# Patient Record
Sex: Male | Born: 1955 | Race: White | Hispanic: No | Smoking: Former smoker
Health system: Southern US, Community
[De-identification: ages and names within clinical notes are randomized; demographics above are authoritative.]

## PROBLEM LIST (undated history)

## (undated) DIAGNOSIS — I82409 Acute embolism and thrombosis of unspecified deep veins of unspecified lower extremity: Secondary | ICD-10-CM

## (undated) DIAGNOSIS — I10 Essential (primary) hypertension: Secondary | ICD-10-CM

## (undated) DIAGNOSIS — M1990 Unspecified osteoarthritis, unspecified site: Secondary | ICD-10-CM

## (undated) DIAGNOSIS — I2699 Other pulmonary embolism without acute cor pulmonale: Secondary | ICD-10-CM

## (undated) HISTORY — DX: Unspecified osteoarthritis, unspecified site: M19.90

## (undated) HISTORY — PX: OTHER SURGICAL HISTORY: SHX169

## (undated) HISTORY — DX: Other pulmonary embolism without acute cor pulmonale: I26.99

## (undated) HISTORY — PX: WRIST GANGLION EXCISION: SHX840

## (undated) HISTORY — DX: Essential (primary) hypertension: I10

## (undated) HISTORY — DX: Acute embolism and thrombosis of unspecified deep veins of unspecified lower extremity: I82.409

---

## 1999-01-17 ENCOUNTER — Ambulatory Visit: Admission: RE | Admit: 1999-01-17 | Discharge: 1999-01-17 | Payer: Self-pay | Admitting: Otolaryngology

## 1999-03-18 ENCOUNTER — Ambulatory Visit: Admission: RE | Admit: 1999-03-18 | Discharge: 1999-03-18 | Payer: Self-pay | Admitting: Otolaryngology

## 2000-03-21 ENCOUNTER — Ambulatory Visit (HOSPITAL_BASED_OUTPATIENT_CLINIC_OR_DEPARTMENT_OTHER): Admission: RE | Admit: 2000-03-21 | Discharge: 2000-03-21 | Payer: Self-pay | Admitting: Orthopaedic Surgery

## 2002-11-12 ENCOUNTER — Encounter: Payer: Self-pay | Admitting: Family Medicine

## 2002-11-12 ENCOUNTER — Encounter: Admission: RE | Admit: 2002-11-12 | Discharge: 2002-11-12 | Payer: Self-pay | Admitting: Family Medicine

## 2003-08-17 ENCOUNTER — Encounter: Payer: Self-pay | Admitting: Emergency Medicine

## 2003-08-17 ENCOUNTER — Emergency Department (HOSPITAL_COMMUNITY): Admission: EM | Admit: 2003-08-17 | Discharge: 2003-08-18 | Payer: Self-pay | Admitting: Emergency Medicine

## 2017-10-05 DIAGNOSIS — R509 Fever, unspecified: Secondary | ICD-10-CM | POA: Diagnosis not present

## 2017-10-05 DIAGNOSIS — J90 Pleural effusion, not elsewhere classified: Secondary | ICD-10-CM | POA: Diagnosis not present

## 2017-10-05 DIAGNOSIS — I2699 Other pulmonary embolism without acute cor pulmonale: Secondary | ICD-10-CM | POA: Diagnosis not present

## 2017-10-05 DIAGNOSIS — R918 Other nonspecific abnormal finding of lung field: Secondary | ICD-10-CM | POA: Diagnosis not present

## 2017-10-05 DIAGNOSIS — J181 Lobar pneumonia, unspecified organism: Secondary | ICD-10-CM | POA: Diagnosis not present

## 2017-10-06 DIAGNOSIS — J181 Lobar pneumonia, unspecified organism: Secondary | ICD-10-CM | POA: Diagnosis not present

## 2017-10-06 DIAGNOSIS — J9601 Acute respiratory failure with hypoxia: Secondary | ICD-10-CM | POA: Diagnosis not present

## 2017-10-06 DIAGNOSIS — R509 Fever, unspecified: Secondary | ICD-10-CM | POA: Diagnosis not present

## 2017-10-06 DIAGNOSIS — R0602 Shortness of breath: Secondary | ICD-10-CM | POA: Diagnosis not present

## 2017-10-06 DIAGNOSIS — I82442 Acute embolism and thrombosis of left tibial vein: Secondary | ICD-10-CM | POA: Diagnosis not present

## 2017-10-06 DIAGNOSIS — J189 Pneumonia, unspecified organism: Secondary | ICD-10-CM | POA: Diagnosis not present

## 2017-10-06 DIAGNOSIS — J90 Pleural effusion, not elsewhere classified: Secondary | ICD-10-CM | POA: Diagnosis not present

## 2017-10-06 DIAGNOSIS — R918 Other nonspecific abnormal finding of lung field: Secondary | ICD-10-CM | POA: Diagnosis not present

## 2017-10-06 DIAGNOSIS — I517 Cardiomegaly: Secondary | ICD-10-CM | POA: Diagnosis not present

## 2017-10-06 DIAGNOSIS — I2699 Other pulmonary embolism without acute cor pulmonale: Secondary | ICD-10-CM | POA: Diagnosis not present

## 2017-10-22 DIAGNOSIS — I82442 Acute embolism and thrombosis of left tibial vein: Secondary | ICD-10-CM | POA: Diagnosis not present

## 2017-10-22 DIAGNOSIS — R221 Localized swelling, mass and lump, neck: Secondary | ICD-10-CM | POA: Diagnosis not present

## 2017-10-22 DIAGNOSIS — I2699 Other pulmonary embolism without acute cor pulmonale: Secondary | ICD-10-CM | POA: Diagnosis not present

## 2017-10-30 ENCOUNTER — Other Ambulatory Visit: Payer: Self-pay | Admitting: Gastroenterology

## 2017-10-30 ENCOUNTER — Other Ambulatory Visit: Payer: Self-pay | Admitting: Obstetrics and Gynecology

## 2017-10-30 DIAGNOSIS — I2699 Other pulmonary embolism without acute cor pulmonale: Secondary | ICD-10-CM | POA: Diagnosis not present

## 2017-10-30 DIAGNOSIS — Z86010 Personal history of colonic polyps: Secondary | ICD-10-CM | POA: Diagnosis not present

## 2017-10-30 DIAGNOSIS — K921 Melena: Secondary | ICD-10-CM | POA: Diagnosis not present

## 2017-10-30 DIAGNOSIS — R221 Localized swelling, mass and lump, neck: Secondary | ICD-10-CM

## 2017-10-31 ENCOUNTER — Other Ambulatory Visit: Payer: Self-pay | Admitting: Gastroenterology

## 2017-10-31 ENCOUNTER — Ambulatory Visit
Admission: RE | Admit: 2017-10-31 | Discharge: 2017-10-31 | Disposition: A | Discharge status: Home / Self Care | Payer: 59 | Source: Ambulatory Visit | Attending: Gastroenterology | Admitting: Gastroenterology

## 2017-10-31 DIAGNOSIS — R221 Localized swelling, mass and lump, neck: Secondary | ICD-10-CM | POA: Diagnosis not present

## 2017-11-07 ENCOUNTER — Other Ambulatory Visit: Payer: Self-pay | Admitting: Gastroenterology

## 2017-11-07 DIAGNOSIS — R221 Localized swelling, mass and lump, neck: Secondary | ICD-10-CM

## 2017-11-07 DIAGNOSIS — I2699 Other pulmonary embolism without acute cor pulmonale: Secondary | ICD-10-CM | POA: Diagnosis not present

## 2017-11-07 DIAGNOSIS — I82432 Acute embolism and thrombosis of left popliteal vein: Secondary | ICD-10-CM | POA: Diagnosis not present

## 2017-11-19 ENCOUNTER — Other Ambulatory Visit: Payer: 59

## 2017-11-22 ENCOUNTER — Ambulatory Visit
Admission: RE | Admit: 2017-11-22 | Discharge: 2017-11-22 | Disposition: A | Discharge status: Home / Self Care | Payer: 59 | Source: Ambulatory Visit | Attending: Gastroenterology | Admitting: Gastroenterology

## 2017-11-22 ENCOUNTER — Other Ambulatory Visit: Payer: 59

## 2017-11-22 DIAGNOSIS — R221 Localized swelling, mass and lump, neck: Secondary | ICD-10-CM | POA: Diagnosis not present

## 2017-11-22 MED ORDER — IOPAMIDOL (ISOVUE-300) INJECTION 61%
75.0000 mL | Freq: Once | INTRAVENOUS | Status: AC | PRN
  Administered 2017-11-22: 75 mL via INTRAVENOUS

## 2017-12-07 DIAGNOSIS — R682 Dry mouth, unspecified: Secondary | ICD-10-CM | POA: Diagnosis not present

## 2017-12-07 DIAGNOSIS — G4733 Obstructive sleep apnea (adult) (pediatric): Secondary | ICD-10-CM | POA: Diagnosis not present

## 2017-12-07 DIAGNOSIS — I2699 Other pulmonary embolism without acute cor pulmonale: Secondary | ICD-10-CM | POA: Diagnosis not present

## 2017-12-21 DIAGNOSIS — M3500 Sicca syndrome, unspecified: Secondary | ICD-10-CM | POA: Diagnosis not present

## 2017-12-21 DIAGNOSIS — G4733 Obstructive sleep apnea (adult) (pediatric): Secondary | ICD-10-CM | POA: Diagnosis not present

## 2017-12-21 DIAGNOSIS — R220 Localized swelling, mass and lump, head: Secondary | ICD-10-CM | POA: Diagnosis not present

## 2018-01-04 DIAGNOSIS — R221 Localized swelling, mass and lump, neck: Secondary | ICD-10-CM | POA: Diagnosis not present

## 2018-01-04 DIAGNOSIS — I2699 Other pulmonary embolism without acute cor pulmonale: Secondary | ICD-10-CM | POA: Diagnosis not present

## 2018-01-14 DIAGNOSIS — G4733 Obstructive sleep apnea (adult) (pediatric): Secondary | ICD-10-CM | POA: Diagnosis not present

## 2018-01-15 DIAGNOSIS — K921 Melena: Secondary | ICD-10-CM | POA: Diagnosis not present

## 2018-01-15 DIAGNOSIS — Z86010 Personal history of colonic polyps: Secondary | ICD-10-CM | POA: Diagnosis not present

## 2018-02-12 DIAGNOSIS — G4733 Obstructive sleep apnea (adult) (pediatric): Secondary | ICD-10-CM | POA: Diagnosis not present

## 2018-02-25 DIAGNOSIS — G4733 Obstructive sleep apnea (adult) (pediatric): Secondary | ICD-10-CM | POA: Diagnosis not present

## 2018-03-04 DIAGNOSIS — K5730 Diverticulosis of large intestine without perforation or abscess without bleeding: Secondary | ICD-10-CM | POA: Diagnosis not present

## 2018-03-04 DIAGNOSIS — D126 Benign neoplasm of colon, unspecified: Secondary | ICD-10-CM | POA: Diagnosis not present

## 2018-03-04 DIAGNOSIS — Z86010 Personal history of colonic polyps: Secondary | ICD-10-CM | POA: Diagnosis not present

## 2018-03-14 DIAGNOSIS — M79601 Pain in right arm: Secondary | ICD-10-CM | POA: Diagnosis not present

## 2018-03-15 ENCOUNTER — Ambulatory Visit (INDEPENDENT_AMBULATORY_CARE_PROVIDER_SITE_OTHER): Payer: 59 | Admitting: Orthopaedic Surgery

## 2018-03-15 ENCOUNTER — Ambulatory Visit (INDEPENDENT_AMBULATORY_CARE_PROVIDER_SITE_OTHER): Payer: 59

## 2018-03-15 ENCOUNTER — Encounter (INDEPENDENT_AMBULATORY_CARE_PROVIDER_SITE_OTHER): Payer: Self-pay | Admitting: Orthopaedic Surgery

## 2018-03-15 VITALS — BP 154/87 | HR 79 | Ht 69.0 in | Wt 225.0 lb

## 2018-03-15 DIAGNOSIS — M25521 Pain in right elbow: Secondary | ICD-10-CM | POA: Diagnosis not present

## 2018-03-15 NOTE — Progress Notes (Signed)
Office Visit Note/Orthopedic consult   Patient: Jeffery Nguyen           Date of Birth: 1956/08/09           MRN: [id number] Visit Date: 03/15/2018              Requested by: Orpah Melter, MD [address] [place],  [id number] PCP: Aura Dials, MD   Assessment & Plan: Visit Diagnoses:  1. Pain in right elbow           Probable partial tear right distal biceps tendon. Plan: Patient likely had a partial tear of the biceps tendon.  He is on Eliquis and does not have any ecchymosis which suggest it was not likely a significant tear.  He is able to demonstrate resistance against elbow flexion.  I plan to recheck him in a month to be careful avoid pulling.  We discussed to be as persistent problems MRI scan would need to be performed. Thank you for the opportunity to see him in consultation.   Follow-Up Instructions: Return in about 1 month (around 04/14/2018).   Orders:  Orders Placed This Encounter  Procedures  . XR Elbow 2 Views Right   No orders of the defined types were placed in this encounter.     Procedures: No procedures performed   Clinical Data: No additional findings.   Subjective: Chief Complaint  Patient presents with  . Right Arm - Pain    HPI 62 year old male referred by  Dr. Orpah Melter for injury when he  was lifting a lawnmower 2 days ago felt 2 pops with pain in the right  antecubital space.  He has had previous repair of his opposite left distal biceps done by me with repair in 1998.  Left arm is done well.  He had a remote injury over the dorsal proximal forearm that was skin grafted by Dr. Larose Kells prior to that.  Patient is not noticed any ecchymosis he said soreness.  He is able to get his elbow in full extension and can flex it but has pain over the distal biceps tendon.  Patient's had pain with resisted elbow flexion points directly over the distal biceps tendon.  Review of Systems negative for diabetes or heart trouble.  He is on  Eliquis for history of pulmonary embolism in October.   Objective: Vital Signs: BP (!) 154/87   Pulse 79   Ht 5\' 9"  (1.753 m)   Wt 225 lb (102.1 kg)   BMI 33.23 kg/m   Physical Exam  Constitutional: He is oriented to person, place, and time. He appears well-developed and well-nourished.  HENT:  Head: Normocephalic and atraumatic.  Eyes: Pupils are equal, round, and reactive to light. EOM are normal.  Neck: No tracheal deviation present. No thyromegaly present.  Cardiovascular: Normal rate.  Pulmonary/Chest: Effort normal. He has no wheezes.  Abdominal: Soft. Bowel sounds are normal.  Neurological: He is alert and oriented to person, place, and time.  Skin: Skin is warm and dry. Capillary refill takes less than 2 seconds.  Psychiatric: He has a normal mood and affect. His behavior is normal. Judgment and thought content normal.    Ortho Exam patient has tenderness of the distal right biceps tendon.  Mild subcutaneous swelling no ecchymosis.  Collateral ligaments are stable.  Sensation of the hand median radial is normal.  Opposite left arm shows healed scar from distal biceps reattachment.  Skin graft over the proximal lateral forearm from  old surgery.  Sensation of the fingers are intact.   Specialty Comments:  No specialty comments available.  Imaging: No results found.   PMFS History: There are no active problems to display for this patient.  Past Medical History:  Diagnosis Date  . Arthritis   . Deep vein thrombosis ([place])   . Hypertension   . Hypertension   . Pulmonary embolism ([place])     History reviewed. No pertinent family history.  Past Surgical History:  Procedure Laterality Date  . torn bicep Left   . WRIST GANGLION EXCISION Right    Social History   Occupational History  . Not on file  Tobacco Use  . Smoking status: Former Research scientist (life sciences)  . Smokeless tobacco: Never Used  Substance and Sexual Activity  . Alcohol use: Yes    Comment: occasional  . Drug  use: Never  . Sexual activity: Not on file

## 2018-03-17 ENCOUNTER — Encounter (INDEPENDENT_AMBULATORY_CARE_PROVIDER_SITE_OTHER): Payer: Self-pay | Admitting: Orthopaedic Surgery

## 2018-03-28 DIAGNOSIS — G4733 Obstructive sleep apnea (adult) (pediatric): Secondary | ICD-10-CM | POA: Diagnosis not present

## 2018-04-16 ENCOUNTER — Ambulatory Visit (INDEPENDENT_AMBULATORY_CARE_PROVIDER_SITE_OTHER): Payer: 59 | Admitting: Orthopaedic Surgery

## 2018-04-16 ENCOUNTER — Encounter (INDEPENDENT_AMBULATORY_CARE_PROVIDER_SITE_OTHER): Payer: Self-pay | Admitting: Orthopaedic Surgery

## 2018-04-16 VITALS — BP 144/85 | HR 53 | Ht 69.0 in | Wt 225.0 lb

## 2018-04-16 DIAGNOSIS — S46111A Strain of muscle, fascia and tendon of long head of biceps, right arm, initial encounter: Secondary | ICD-10-CM | POA: Insufficient documentation

## 2018-04-16 DIAGNOSIS — M66821 Spontaneous rupture of other tendons, right upper arm: Secondary | ICD-10-CM

## 2018-04-16 NOTE — Progress Notes (Signed)
Office Visit Note   Patient: Jeffery Nguyen           Date of Birth: 1955/12/16           MRN: [id number] Visit Date: 04/16/2018              Requested by: Aura Dials, [place], [place] [address] PCP: Aura Dials, MD   Assessment & Plan: Visit Diagnoses:  1. Partial degenerative rupture of biceps tendon, right     Plan: Patient will avoid any hard pulling with his right arm.  We discussed that if he continues to have symptoms over several months then repair would be reconsidered.  He is on Eliquis and might have to be bridged.  If he has increased symptoms he will call.  Follow-Up Instructions: No follow-ups on file.   Orders:  No orders of the defined types were placed in this encounter.  No orders of the defined types were placed in this encounter.     Procedures: No procedures performed   Clinical Data: No additional findings.   Subjective: Chief Complaint  Patient presents with  . Right Elbow - Follow-up    HPI 62 year old male returns approximately 1 month post partial rupture distal biceps tendon tear when he was lifting a lawnmower.  He states his symptoms have improved he is not taking pain medication.  Date of injury was 03/13/2018.  Of note his past history of opposite side left biceps tendon rupture and repair done in 1998 by me.  Left arm is been doing well.  Review of Systems 10 point review of systems updated unchanged.  He is on Eliquis for history of pulmonary embolism in October.   Objective: Vital Signs: BP (!) 144/85 (BP Location: Left Arm, Patient Position: Sitting, Cuff Size: Normal)   Pulse (!) 53   Ht 5\' 9"  (1.753 m)   Wt 225 lb (102.1 kg)   BMI 33.23 kg/m   Physical Exam  Constitutional: He is oriented to person, place, and time. He appears well-developed and well-nourished.  HENT:  Head: Normocephalic and atraumatic.  Eyes: Pupils are equal, round, and reactive to light. EOM are normal.  Neck: No tracheal  deviation present. No thyromegaly present.  Cardiovascular: Normal rate.  Pulmonary/Chest: Effort normal. He has no wheezes.  Abdominal: Soft. Bowel sounds are normal.  Neurological: He is alert and oriented to person, place, and time.  Skin: Skin is warm and dry. Capillary refill takes less than 2 seconds.  Psychiatric: He has a normal mood and affect. His behavior is normal. Judgment and thought content normal.    Ortho Exam patient has some mild prominence to do 3 cm proximal to the antecubital crease with mild tenderness.  Distal biceps tendon is still palpable and he has resistance against biceps testing on the right.  Left antecubital scar is well-healed.  Specialty Comments:  No specialty comments available.  Imaging: No results found.   PMFS History: There are no active problems to display for this patient.  Past Medical History:  Diagnosis Date  . Arthritis   . Deep vein thrombosis ([place])   . Hypertension   . Hypertension   . Pulmonary embolism ([place])     No family history on file.  Past Surgical History:  Procedure Laterality Date  . torn bicep Left   . WRIST GANGLION EXCISION Right    Social History   Occupational History  . Not on file  Tobacco Use  .  Smoking status: Former Research scientist (life sciences)  . Smokeless tobacco: Never Used  Substance and Sexual Activity  . Alcohol use: Yes    Comment: occasional  . Drug use: Never  . Sexual activity: Not on file

## 2018-04-27 DIAGNOSIS — G4733 Obstructive sleep apnea (adult) (pediatric): Secondary | ICD-10-CM | POA: Diagnosis not present

## 2018-04-29 DIAGNOSIS — G4733 Obstructive sleep apnea (adult) (pediatric): Secondary | ICD-10-CM | POA: Diagnosis not present

## 2018-05-28 DIAGNOSIS — G4733 Obstructive sleep apnea (adult) (pediatric): Secondary | ICD-10-CM | POA: Diagnosis not present

## 2018-06-05 DIAGNOSIS — G4733 Obstructive sleep apnea (adult) (pediatric): Secondary | ICD-10-CM | POA: Diagnosis not present

## 2018-06-27 DIAGNOSIS — G4733 Obstructive sleep apnea (adult) (pediatric): Secondary | ICD-10-CM | POA: Diagnosis not present

## 2018-07-28 DIAGNOSIS — G4733 Obstructive sleep apnea (adult) (pediatric): Secondary | ICD-10-CM | POA: Diagnosis not present

## 2018-08-28 DIAGNOSIS — G4733 Obstructive sleep apnea (adult) (pediatric): Secondary | ICD-10-CM | POA: Diagnosis not present

## 2018-09-27 DIAGNOSIS — G4733 Obstructive sleep apnea (adult) (pediatric): Secondary | ICD-10-CM | POA: Diagnosis not present

## 2018-10-28 DIAGNOSIS — G4733 Obstructive sleep apnea (adult) (pediatric): Secondary | ICD-10-CM | POA: Diagnosis not present

## 2018-11-27 DIAGNOSIS — G4733 Obstructive sleep apnea (adult) (pediatric): Secondary | ICD-10-CM | POA: Diagnosis not present

## 2019-05-03 IMAGING — US US THYROID
1 series · 13 of 25 positions shown · non-contrast
Comparison: None.

CLINICAL DATA: Palpable abnormality.  Neck mass.

EXAM:
THYROID ULTRASOUND
TECHNIQUE: Ultrasound examination of the thyroid gland and adjacent soft
tissues was performed.

[Series 1: us thyroid · 0.11mm/px · 13 of 44 slices shown]
[im 1/44]
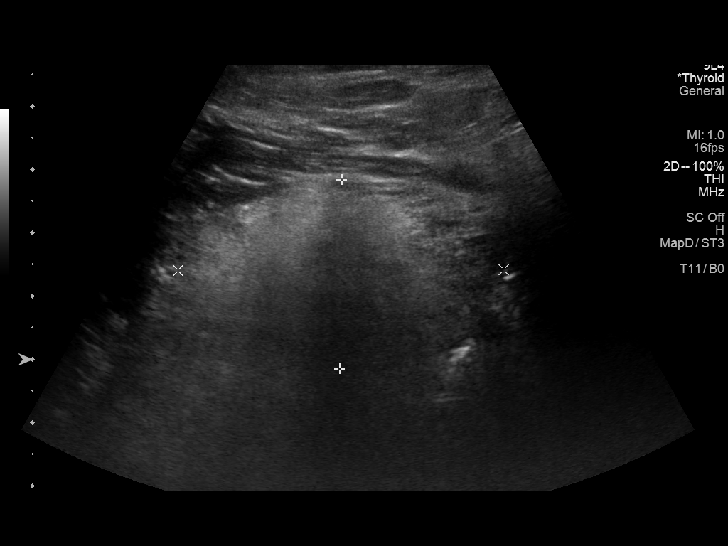
[im 4/44]
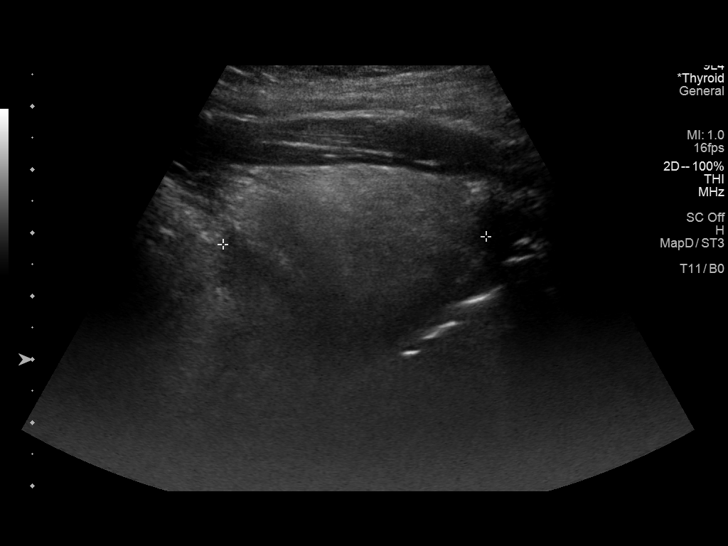
[im 8/44]
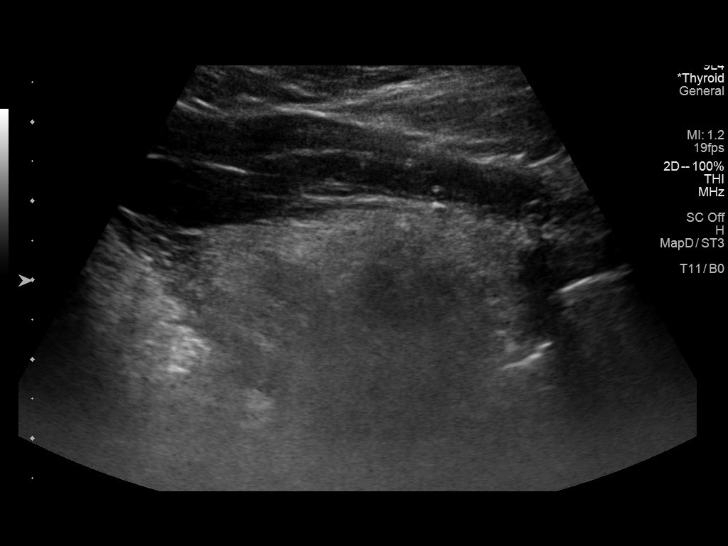
[im 11/44]
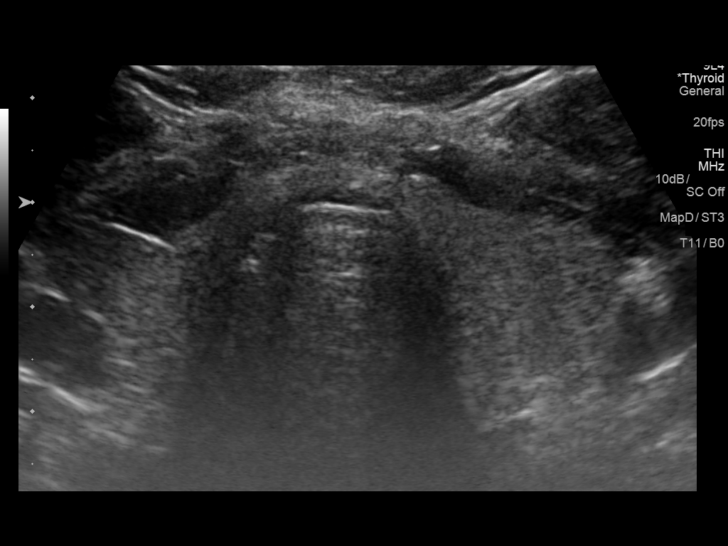
[im 15/44]
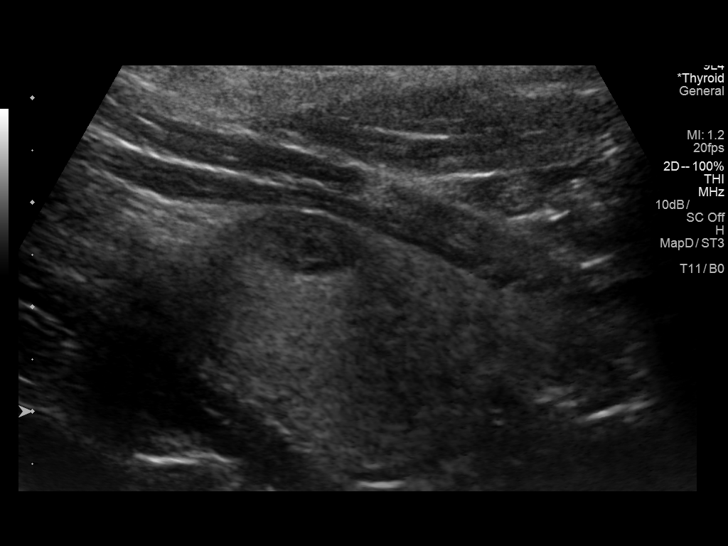
[im 18/44]
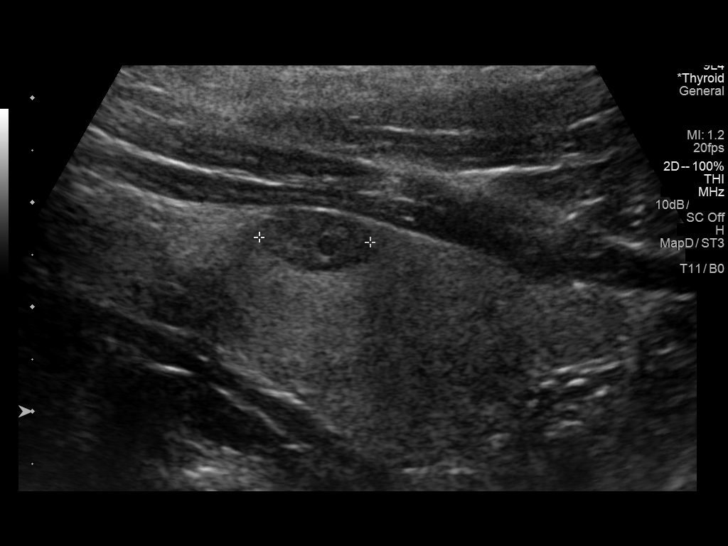
[im 22/44]
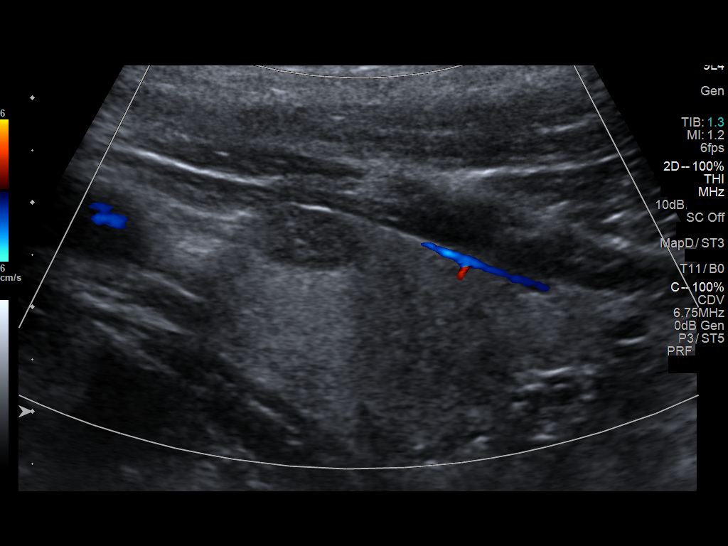
[im 26/44]
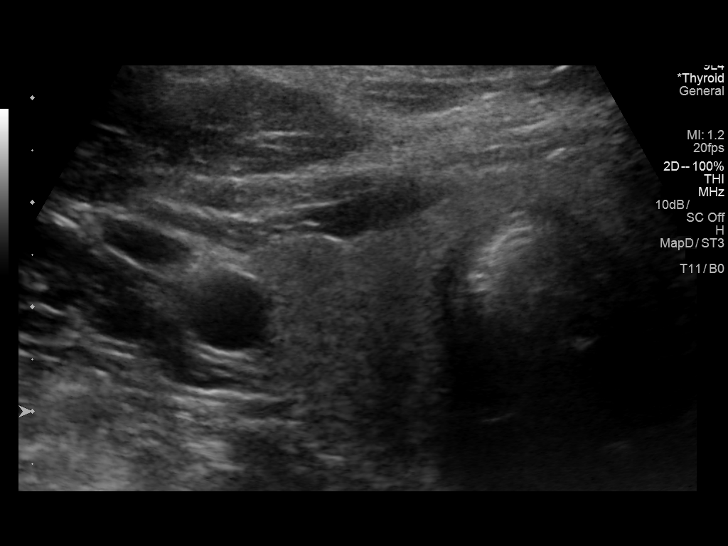
[im 29/44]
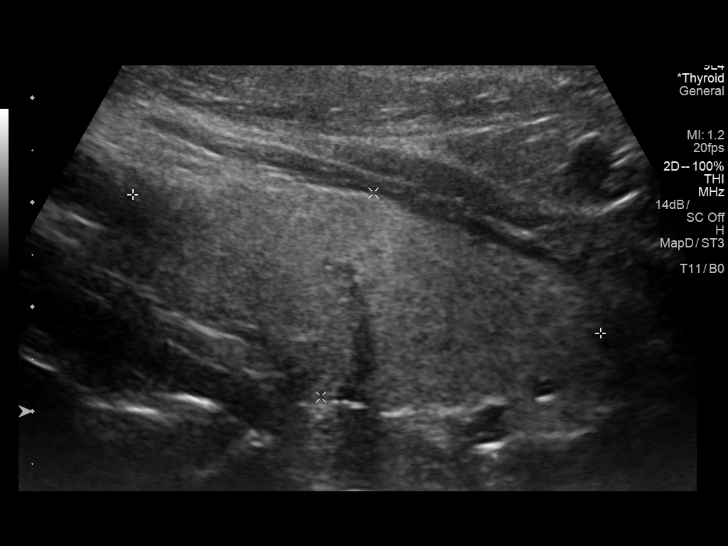
[im 33/44]
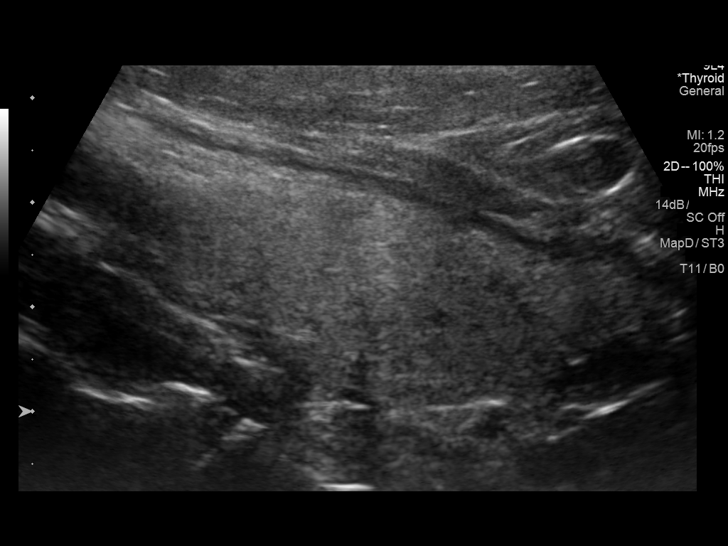
[im 36/44]
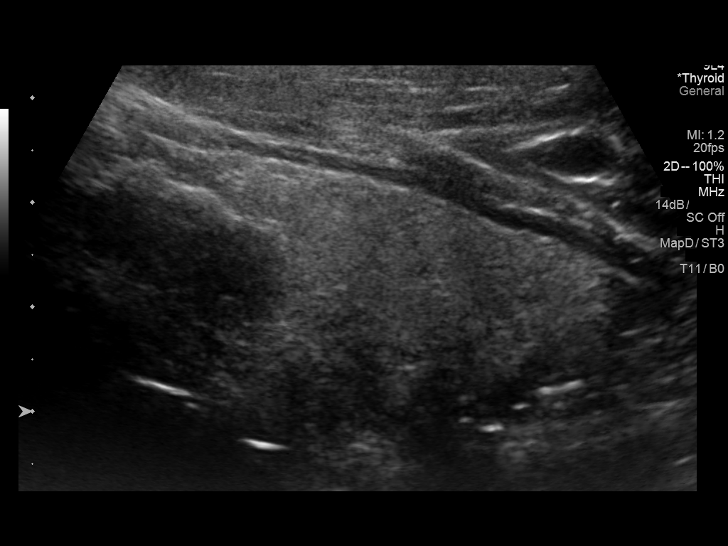
[im 40/44]
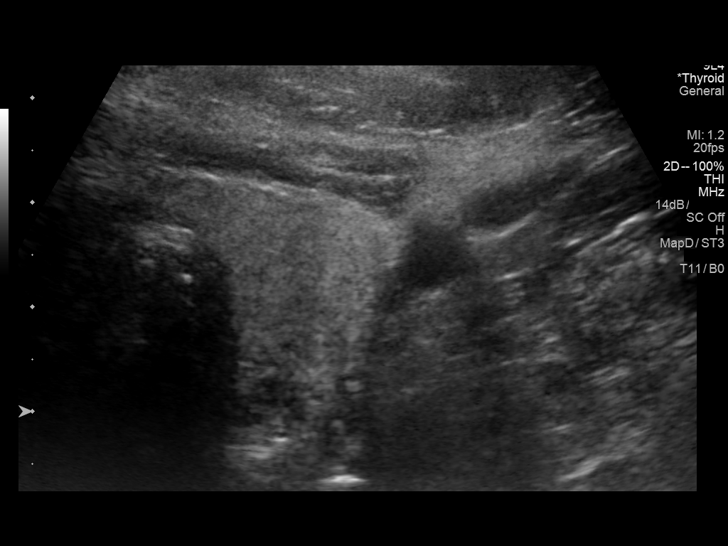
[im 44/44]
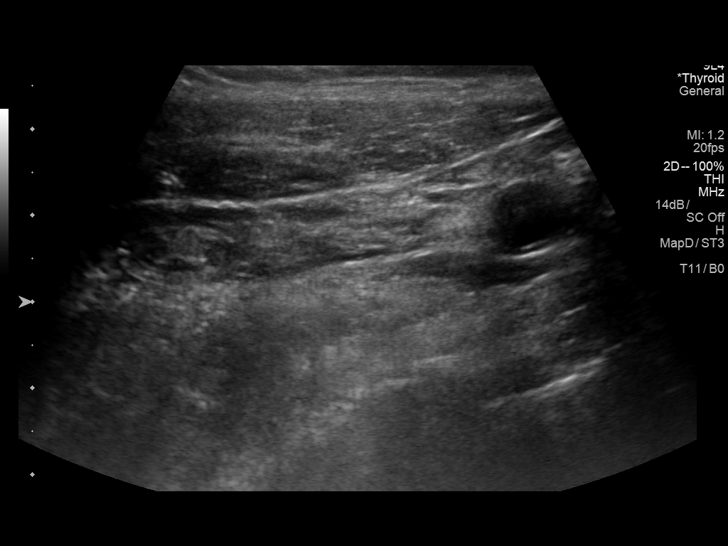

[13 of 25 positions shown; findings below may reference images not displayed]

FINDINGS: Parenchymal Echotexture: Mildly heterogenous

Isthmus: 0.3 cm

Right lobe: 5.0 x 2.4 x 1.7 cm

Left lobe: 4.7 x 2.0 x 1.9 cm

_________________________________________________________

Estimated total number of nodules >/= 1 cm: 1

Number of spongiform nodules >/=  2 cm not described below (TR1): 0

Number of mixed cystic and solid nodules >/= 1.5 cm not described
below (TR2): 0

_________________________________________________________

Nodule # 1:

Location: Right; Superior

Maximum size: 1.1 cm; Other 2 dimensions: 0.9 x 0.5 cm

Composition: solid/almost completely solid (2)

Echogenicity: hypoechoic (2)

Shape: not taller-than-wide (0)

Margins: smooth (0)

Echogenic foci: none (0)

ACR TI-RADS total points: 4.

ACR TI-RADS risk category: TR4 (4-6 points).

ACR TI-RADS recommendations:

*Given size (>/= 1 - 1.4 cm) and appearance, a follow-up ultrasound
in 1 year should be considered based on TI-RADS criteria.

_________________________________________________________

At the palpable abnormality in the left submandibular region, there
is are 4.2 x 3.0 x 5.1 cm nonspecific isoechoic mass. There is some
internal vascularity on color Doppler imaging.
IMPRESSION: Right hypoechoic thyroid nodule 1 meets criteria for annual
follow-up.

The palpable abnormality in the left submandibular region
corresponds to a nonspecific mass measuring 5.1 cm. Malignancy is
not excluded. CT neck with contrast is recommended.

The above is in keeping with the ACR TI-RADS recommendations - [HOSPITAL] 6115;[DATE].

## 2019-05-07 DIAGNOSIS — G4733 Obstructive sleep apnea (adult) (pediatric): Secondary | ICD-10-CM | POA: Diagnosis not present

## 2021-02-04 ENCOUNTER — Ambulatory Visit: Payer: 59 | Admitting: Registered"

## 2023-07-31 DIAGNOSIS — E119 Type 2 diabetes mellitus without complications: Secondary | ICD-10-CM | POA: Diagnosis not present

## 2023-07-31 DIAGNOSIS — E559 Vitamin D deficiency, unspecified: Secondary | ICD-10-CM | POA: Diagnosis not present

## 2023-07-31 DIAGNOSIS — I10 Essential (primary) hypertension: Secondary | ICD-10-CM | POA: Diagnosis not present

## 2023-07-31 DIAGNOSIS — E782 Mixed hyperlipidemia: Secondary | ICD-10-CM | POA: Diagnosis not present

## 2023-07-31 DIAGNOSIS — Z1331 Encounter for screening for depression: Secondary | ICD-10-CM | POA: Diagnosis not present

## 2023-07-31 DIAGNOSIS — Z86711 Personal history of pulmonary embolism: Secondary | ICD-10-CM | POA: Diagnosis not present

## 2023-07-31 DIAGNOSIS — Z6835 Body mass index (BMI) 35.0-35.9, adult: Secondary | ICD-10-CM | POA: Diagnosis not present

## 2023-07-31 DIAGNOSIS — E8889 Other specified metabolic disorders: Secondary | ICD-10-CM | POA: Diagnosis not present

## 2023-07-31 DIAGNOSIS — G4733 Obstructive sleep apnea (adult) (pediatric): Secondary | ICD-10-CM | POA: Diagnosis not present

## 2023-07-31 DIAGNOSIS — Z87442 Personal history of urinary calculi: Secondary | ICD-10-CM | POA: Diagnosis not present

## 2023-08-14 DIAGNOSIS — Z87442 Personal history of urinary calculi: Secondary | ICD-10-CM | POA: Diagnosis not present

## 2023-08-14 DIAGNOSIS — E782 Mixed hyperlipidemia: Secondary | ICD-10-CM | POA: Diagnosis not present

## 2023-08-14 DIAGNOSIS — E119 Type 2 diabetes mellitus without complications: Secondary | ICD-10-CM | POA: Diagnosis not present

## 2023-08-14 DIAGNOSIS — G4733 Obstructive sleep apnea (adult) (pediatric): Secondary | ICD-10-CM | POA: Diagnosis not present

## 2023-08-14 DIAGNOSIS — I10 Essential (primary) hypertension: Secondary | ICD-10-CM | POA: Diagnosis not present

## 2023-08-14 DIAGNOSIS — E559 Vitamin D deficiency, unspecified: Secondary | ICD-10-CM | POA: Diagnosis not present

## 2023-08-14 DIAGNOSIS — Z86711 Personal history of pulmonary embolism: Secondary | ICD-10-CM | POA: Diagnosis not present

## 2023-08-14 DIAGNOSIS — Z6835 Body mass index (BMI) 35.0-35.9, adult: Secondary | ICD-10-CM | POA: Diagnosis not present

## 2023-08-28 DIAGNOSIS — Z6835 Body mass index (BMI) 35.0-35.9, adult: Secondary | ICD-10-CM | POA: Diagnosis not present

## 2023-08-28 DIAGNOSIS — E559 Vitamin D deficiency, unspecified: Secondary | ICD-10-CM | POA: Diagnosis not present

## 2023-08-28 DIAGNOSIS — E782 Mixed hyperlipidemia: Secondary | ICD-10-CM | POA: Diagnosis not present

## 2023-08-28 DIAGNOSIS — E119 Type 2 diabetes mellitus without complications: Secondary | ICD-10-CM | POA: Diagnosis not present

## 2023-08-28 DIAGNOSIS — Z86711 Personal history of pulmonary embolism: Secondary | ICD-10-CM | POA: Diagnosis not present

## 2023-08-28 DIAGNOSIS — G4733 Obstructive sleep apnea (adult) (pediatric): Secondary | ICD-10-CM | POA: Diagnosis not present

## 2023-08-28 DIAGNOSIS — I10 Essential (primary) hypertension: Secondary | ICD-10-CM | POA: Diagnosis not present

## 2023-08-28 DIAGNOSIS — Z87442 Personal history of urinary calculi: Secondary | ICD-10-CM | POA: Diagnosis not present

## 2023-09-24 DIAGNOSIS — Z87442 Personal history of urinary calculi: Secondary | ICD-10-CM | POA: Diagnosis not present

## 2023-09-24 DIAGNOSIS — E559 Vitamin D deficiency, unspecified: Secondary | ICD-10-CM | POA: Diagnosis not present

## 2023-09-24 DIAGNOSIS — I10 Essential (primary) hypertension: Secondary | ICD-10-CM | POA: Diagnosis not present

## 2023-09-24 DIAGNOSIS — E119 Type 2 diabetes mellitus without complications: Secondary | ICD-10-CM | POA: Diagnosis not present

## 2023-09-24 DIAGNOSIS — E782 Mixed hyperlipidemia: Secondary | ICD-10-CM | POA: Diagnosis not present

## 2023-09-24 DIAGNOSIS — Z86711 Personal history of pulmonary embolism: Secondary | ICD-10-CM | POA: Diagnosis not present

## 2023-09-24 DIAGNOSIS — G4733 Obstructive sleep apnea (adult) (pediatric): Secondary | ICD-10-CM | POA: Diagnosis not present

## 2023-09-24 DIAGNOSIS — Z6835 Body mass index (BMI) 35.0-35.9, adult: Secondary | ICD-10-CM | POA: Diagnosis not present

## 2023-10-23 DIAGNOSIS — Z6835 Body mass index (BMI) 35.0-35.9, adult: Secondary | ICD-10-CM | POA: Diagnosis not present

## 2023-10-23 DIAGNOSIS — I10 Essential (primary) hypertension: Secondary | ICD-10-CM | POA: Diagnosis not present

## 2023-10-23 DIAGNOSIS — E782 Mixed hyperlipidemia: Secondary | ICD-10-CM | POA: Diagnosis not present

## 2023-10-23 DIAGNOSIS — Z86711 Personal history of pulmonary embolism: Secondary | ICD-10-CM | POA: Diagnosis not present

## 2023-10-23 DIAGNOSIS — E119 Type 2 diabetes mellitus without complications: Secondary | ICD-10-CM | POA: Diagnosis not present

## 2023-10-23 DIAGNOSIS — G4733 Obstructive sleep apnea (adult) (pediatric): Secondary | ICD-10-CM | POA: Diagnosis not present

## 2023-10-23 DIAGNOSIS — E559 Vitamin D deficiency, unspecified: Secondary | ICD-10-CM | POA: Diagnosis not present

## 2023-10-23 DIAGNOSIS — Z87442 Personal history of urinary calculi: Secondary | ICD-10-CM | POA: Diagnosis not present

## 2023-10-26 DIAGNOSIS — Z9989 Dependence on other enabling machines and devices: Secondary | ICD-10-CM | POA: Diagnosis not present

## 2023-10-26 DIAGNOSIS — I700 Atherosclerosis of aorta: Secondary | ICD-10-CM | POA: Diagnosis not present

## 2023-10-26 DIAGNOSIS — Z86718 Personal history of other venous thrombosis and embolism: Secondary | ICD-10-CM | POA: Diagnosis not present

## 2023-10-26 DIAGNOSIS — I10 Essential (primary) hypertension: Secondary | ICD-10-CM | POA: Diagnosis not present

## 2023-10-26 DIAGNOSIS — E119 Type 2 diabetes mellitus without complications: Secondary | ICD-10-CM | POA: Diagnosis not present

## 2023-10-26 DIAGNOSIS — D6869 Other thrombophilia: Secondary | ICD-10-CM | POA: Diagnosis not present

## 2023-10-26 DIAGNOSIS — G4733 Obstructive sleep apnea (adult) (pediatric): Secondary | ICD-10-CM | POA: Diagnosis not present

## 2023-10-26 DIAGNOSIS — E782 Mixed hyperlipidemia: Secondary | ICD-10-CM | POA: Diagnosis not present

## 2023-10-26 DIAGNOSIS — J439 Emphysema, unspecified: Secondary | ICD-10-CM | POA: Diagnosis not present

## 2024-02-12 DIAGNOSIS — E782 Mixed hyperlipidemia: Secondary | ICD-10-CM | POA: Diagnosis not present

## 2024-02-12 DIAGNOSIS — Z86711 Personal history of pulmonary embolism: Secondary | ICD-10-CM | POA: Diagnosis not present

## 2024-02-12 DIAGNOSIS — Z87442 Personal history of urinary calculi: Secondary | ICD-10-CM | POA: Diagnosis not present

## 2024-02-12 DIAGNOSIS — E119 Type 2 diabetes mellitus without complications: Secondary | ICD-10-CM | POA: Diagnosis not present

## 2024-02-12 DIAGNOSIS — G4733 Obstructive sleep apnea (adult) (pediatric): Secondary | ICD-10-CM | POA: Diagnosis not present

## 2024-02-12 DIAGNOSIS — Z6832 Body mass index (BMI) 32.0-32.9, adult: Secondary | ICD-10-CM | POA: Diagnosis not present

## 2024-02-12 DIAGNOSIS — E559 Vitamin D deficiency, unspecified: Secondary | ICD-10-CM | POA: Diagnosis not present

## 2024-02-12 DIAGNOSIS — I10 Essential (primary) hypertension: Secondary | ICD-10-CM | POA: Diagnosis not present

## 2024-02-19 DIAGNOSIS — I10 Essential (primary) hypertension: Secondary | ICD-10-CM | POA: Diagnosis not present

## 2024-02-19 DIAGNOSIS — E119 Type 2 diabetes mellitus without complications: Secondary | ICD-10-CM | POA: Diagnosis not present

## 2024-02-19 DIAGNOSIS — E785 Hyperlipidemia, unspecified: Secondary | ICD-10-CM | POA: Diagnosis not present

## 2024-04-15 DIAGNOSIS — E119 Type 2 diabetes mellitus without complications: Secondary | ICD-10-CM | POA: Diagnosis not present

## 2024-04-15 DIAGNOSIS — Z86711 Personal history of pulmonary embolism: Secondary | ICD-10-CM | POA: Diagnosis not present

## 2024-04-15 DIAGNOSIS — I10 Essential (primary) hypertension: Secondary | ICD-10-CM | POA: Diagnosis not present

## 2024-04-15 DIAGNOSIS — G4733 Obstructive sleep apnea (adult) (pediatric): Secondary | ICD-10-CM | POA: Diagnosis not present

## 2024-04-15 DIAGNOSIS — Z87442 Personal history of urinary calculi: Secondary | ICD-10-CM | POA: Diagnosis not present

## 2024-04-15 DIAGNOSIS — E782 Mixed hyperlipidemia: Secondary | ICD-10-CM | POA: Diagnosis not present

## 2024-04-15 DIAGNOSIS — Z6833 Body mass index (BMI) 33.0-33.9, adult: Secondary | ICD-10-CM | POA: Diagnosis not present

## 2024-04-15 DIAGNOSIS — E559 Vitamin D deficiency, unspecified: Secondary | ICD-10-CM | POA: Diagnosis not present

## 2024-05-13 DIAGNOSIS — Z6832 Body mass index (BMI) 32.0-32.9, adult: Secondary | ICD-10-CM | POA: Diagnosis not present

## 2024-05-13 DIAGNOSIS — E559 Vitamin D deficiency, unspecified: Secondary | ICD-10-CM | POA: Diagnosis not present

## 2024-05-13 DIAGNOSIS — I10 Essential (primary) hypertension: Secondary | ICD-10-CM | POA: Diagnosis not present

## 2024-05-13 DIAGNOSIS — E119 Type 2 diabetes mellitus without complications: Secondary | ICD-10-CM | POA: Diagnosis not present

## 2024-05-13 DIAGNOSIS — Z86711 Personal history of pulmonary embolism: Secondary | ICD-10-CM | POA: Diagnosis not present

## 2024-05-13 DIAGNOSIS — E782 Mixed hyperlipidemia: Secondary | ICD-10-CM | POA: Diagnosis not present

## 2024-05-13 DIAGNOSIS — Z87442 Personal history of urinary calculi: Secondary | ICD-10-CM | POA: Diagnosis not present

## 2024-05-13 DIAGNOSIS — G4733 Obstructive sleep apnea (adult) (pediatric): Secondary | ICD-10-CM | POA: Diagnosis not present

## 2024-06-03 DIAGNOSIS — I10 Essential (primary) hypertension: Secondary | ICD-10-CM | POA: Diagnosis not present

## 2024-06-03 DIAGNOSIS — G4733 Obstructive sleep apnea (adult) (pediatric): Secondary | ICD-10-CM | POA: Diagnosis not present

## 2024-06-19 DIAGNOSIS — E119 Type 2 diabetes mellitus without complications: Secondary | ICD-10-CM | POA: Diagnosis not present

## 2024-06-19 DIAGNOSIS — E559 Vitamin D deficiency, unspecified: Secondary | ICD-10-CM | POA: Diagnosis not present

## 2024-06-19 DIAGNOSIS — Z6832 Body mass index (BMI) 32.0-32.9, adult: Secondary | ICD-10-CM | POA: Diagnosis not present

## 2024-06-19 DIAGNOSIS — E782 Mixed hyperlipidemia: Secondary | ICD-10-CM | POA: Diagnosis not present

## 2024-06-19 DIAGNOSIS — I10 Essential (primary) hypertension: Secondary | ICD-10-CM | POA: Diagnosis not present

## 2024-06-19 DIAGNOSIS — Z86711 Personal history of pulmonary embolism: Secondary | ICD-10-CM | POA: Diagnosis not present

## 2024-06-19 DIAGNOSIS — G4733 Obstructive sleep apnea (adult) (pediatric): Secondary | ICD-10-CM | POA: Diagnosis not present

## 2024-06-19 DIAGNOSIS — Z87442 Personal history of urinary calculi: Secondary | ICD-10-CM | POA: Diagnosis not present

## 2024-07-11 DIAGNOSIS — B029 Zoster without complications: Secondary | ICD-10-CM | POA: Diagnosis not present

## 2024-07-24 DIAGNOSIS — E119 Type 2 diabetes mellitus without complications: Secondary | ICD-10-CM | POA: Diagnosis not present

## 2024-07-24 DIAGNOSIS — I10 Essential (primary) hypertension: Secondary | ICD-10-CM | POA: Diagnosis not present

## 2024-07-24 DIAGNOSIS — Z87442 Personal history of urinary calculi: Secondary | ICD-10-CM | POA: Diagnosis not present

## 2024-07-24 DIAGNOSIS — Z86711 Personal history of pulmonary embolism: Secondary | ICD-10-CM | POA: Diagnosis not present

## 2024-07-24 DIAGNOSIS — Z6832 Body mass index (BMI) 32.0-32.9, adult: Secondary | ICD-10-CM | POA: Diagnosis not present

## 2024-07-24 DIAGNOSIS — G4733 Obstructive sleep apnea (adult) (pediatric): Secondary | ICD-10-CM | POA: Diagnosis not present

## 2024-07-24 DIAGNOSIS — E559 Vitamin D deficiency, unspecified: Secondary | ICD-10-CM | POA: Diagnosis not present

## 2024-07-24 DIAGNOSIS — E782 Mixed hyperlipidemia: Secondary | ICD-10-CM | POA: Diagnosis not present

## 2024-07-29 DIAGNOSIS — Z7901 Long term (current) use of anticoagulants: Secondary | ICD-10-CM | POA: Diagnosis not present

## 2024-07-29 DIAGNOSIS — E7800 Pure hypercholesterolemia, unspecified: Secondary | ICD-10-CM | POA: Diagnosis not present

## 2024-07-29 DIAGNOSIS — Z6832 Body mass index (BMI) 32.0-32.9, adult: Secondary | ICD-10-CM | POA: Diagnosis not present

## 2024-07-29 DIAGNOSIS — E119 Type 2 diabetes mellitus without complications: Secondary | ICD-10-CM | POA: Diagnosis not present

## 2024-07-29 DIAGNOSIS — G4733 Obstructive sleep apnea (adult) (pediatric): Secondary | ICD-10-CM | POA: Diagnosis not present

## 2024-07-29 DIAGNOSIS — B029 Zoster without complications: Secondary | ICD-10-CM | POA: Diagnosis not present

## 2024-07-29 DIAGNOSIS — D751 Secondary polycythemia: Secondary | ICD-10-CM | POA: Diagnosis not present

## 2024-07-29 DIAGNOSIS — I10 Essential (primary) hypertension: Secondary | ICD-10-CM | POA: Diagnosis not present

## 2024-09-04 DIAGNOSIS — E782 Mixed hyperlipidemia: Secondary | ICD-10-CM | POA: Diagnosis not present

## 2024-09-04 DIAGNOSIS — Z86711 Personal history of pulmonary embolism: Secondary | ICD-10-CM | POA: Diagnosis not present

## 2024-09-04 DIAGNOSIS — Z87442 Personal history of urinary calculi: Secondary | ICD-10-CM | POA: Diagnosis not present

## 2024-09-04 DIAGNOSIS — Z6831 Body mass index (BMI) 31.0-31.9, adult: Secondary | ICD-10-CM | POA: Diagnosis not present

## 2024-09-04 DIAGNOSIS — G4733 Obstructive sleep apnea (adult) (pediatric): Secondary | ICD-10-CM | POA: Diagnosis not present

## 2024-09-04 DIAGNOSIS — E119 Type 2 diabetes mellitus without complications: Secondary | ICD-10-CM | POA: Diagnosis not present

## 2024-09-04 DIAGNOSIS — I10 Essential (primary) hypertension: Secondary | ICD-10-CM | POA: Diagnosis not present

## 2024-09-04 DIAGNOSIS — E559 Vitamin D deficiency, unspecified: Secondary | ICD-10-CM | POA: Diagnosis not present

## 2024-10-30 DIAGNOSIS — G4733 Obstructive sleep apnea (adult) (pediatric): Secondary | ICD-10-CM | POA: Diagnosis not present

## 2024-10-30 DIAGNOSIS — Z87442 Personal history of urinary calculi: Secondary | ICD-10-CM | POA: Diagnosis not present

## 2024-10-30 DIAGNOSIS — Z86711 Personal history of pulmonary embolism: Secondary | ICD-10-CM | POA: Diagnosis not present

## 2024-10-30 DIAGNOSIS — I10 Essential (primary) hypertension: Secondary | ICD-10-CM | POA: Diagnosis not present

## 2024-10-30 DIAGNOSIS — Z6832 Body mass index (BMI) 32.0-32.9, adult: Secondary | ICD-10-CM | POA: Diagnosis not present

## 2024-10-30 DIAGNOSIS — E782 Mixed hyperlipidemia: Secondary | ICD-10-CM | POA: Diagnosis not present

## 2024-10-30 DIAGNOSIS — E559 Vitamin D deficiency, unspecified: Secondary | ICD-10-CM | POA: Diagnosis not present

## 2024-10-30 DIAGNOSIS — E119 Type 2 diabetes mellitus without complications: Secondary | ICD-10-CM | POA: Diagnosis not present
